# Patient Record
Sex: Male | Born: 1995 | Race: White | Hispanic: No | Marital: Married | Smoking: Former smoker
Health system: Southern US, Academic
[De-identification: ages and names within clinical notes are randomized; demographics above are authoritative.]

## PROBLEM LIST (undated history)

## (undated) DIAGNOSIS — E782 Mixed hyperlipidemia: Secondary | ICD-10-CM

## (undated) DIAGNOSIS — E785 Hyperlipidemia, unspecified: Secondary | ICD-10-CM

## (undated) HISTORY — PX: TYMPANOSTOMY TUBE PLACEMENT: SHX32

## (undated) HISTORY — DX: Hyperlipidemia, unspecified: E78.5

---
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
## 2019-04-27 ENCOUNTER — Ambulatory Visit: Payer: Self-pay | Admitting: Family Medicine

## 2019-06-02 ENCOUNTER — Ambulatory Visit: Payer: Self-pay | Admitting: Skilled Nursing Facility1

## 2019-06-08 ENCOUNTER — Telehealth: Payer: 59 | Admitting: Skilled Nursing Facility1

## 2019-07-07 ENCOUNTER — Telehealth: Payer: 59 | Admitting: Skilled Nursing Facility1

## 2019-07-15 ENCOUNTER — Ambulatory Visit: Payer: 59 | Attending: Internal Medicine

## 2019-07-15 DIAGNOSIS — Z23 Encounter for immunization: Secondary | ICD-10-CM

## 2019-07-15 NOTE — Progress Notes (Signed)
   Covid-19 Vaccination Clinic  Name:  Donald Nunez    MRN: [id number] DOB: [birth year]  07/15/2019  [name] was observed post Covid-19 immunization for 15 minutes without incident. He was provided with Vaccine Information Sheet and instruction to access the V-Safe system.   [name] was instructed to call 911 with any severe reactions post vaccine: Marland Kitchen Difficulty breathing  . Swelling of face and throat  . A fast heartbeat  . A bad rash all over body  . Dizziness and weakness   Immunizations Administered    Name Date Dose VIS Date Route   Pfizer COVID-19 Vaccine 07/15/2019  9:59 AM 0.3 mL 04/15/2019 Intramuscular   Manufacturer: [employer], [employer]   Lot: [id number]   NDC: [address]-7

## 2019-08-09 ENCOUNTER — Ambulatory Visit: Payer: 59 | Attending: Internal Medicine

## 2019-08-09 DIAGNOSIS — Z23 Encounter for immunization: Secondary | ICD-10-CM

## 2019-08-09 NOTE — Progress Notes (Signed)
   Covid-19 Vaccination Clinic  Name:  Donald Nunez    MRN: [id number] DOB: [birth year]  08/09/2019  [name] was observed post Covid-19 immunization for 15 minutes without incident. He was provided with Vaccine Information Sheet and instruction to access the V-Safe system.   [name] was instructed to call 911 with any severe reactions post vaccine: Marland Kitchen Difficulty breathing  . Swelling of face and throat  . A fast heartbeat  . A bad rash all over body  . Dizziness and weakness   Immunizations Administered    Name Date Dose VIS Date Route   Pfizer COVID-19 Vaccine 08/09/2019 10:23 AM 0.3 mL 04/15/2019 Intramuscular   Manufacturer: [employer], [employer]   Lot: [id number]   NDC: [address]-9

## 2021-08-25 ENCOUNTER — Other Ambulatory Visit: Payer: Self-pay

## 2021-08-25 ENCOUNTER — Emergency Department (HOSPITAL_COMMUNITY): Payer: Self-pay

## 2021-08-25 ENCOUNTER — Emergency Department
Admission: EM | Admit: 2021-08-25 | Discharge: 2021-08-25 | Discharge status: Against Medical Advice | Payer: Self-pay | Attending: Family | Admitting: Family

## 2021-08-25 ENCOUNTER — Encounter (HOSPITAL_COMMUNITY): Payer: Self-pay | Admitting: Family

## 2021-08-25 DIAGNOSIS — Z5329 Procedure and treatment not carried out because of patient's decision for other reasons: Secondary | ICD-10-CM | POA: Insufficient documentation

## 2021-08-25 DIAGNOSIS — E785 Hyperlipidemia, unspecified: Secondary | ICD-10-CM | POA: Insufficient documentation

## 2021-08-25 DIAGNOSIS — R079 Chest pain, unspecified: Secondary | ICD-10-CM | POA: Insufficient documentation

## 2021-08-25 HISTORY — DX: Mixed hyperlipidemia: E78.2

## 2021-08-25 LAB — CBC WITH DIFF
BASOPHIL #: 0 10*3/uL (ref 0.00–0.30)
BASOPHIL %: 0 % (ref 0–3)
EOSINOPHIL #: 0.1 10*3/uL (ref 0.00–0.80)
EOSINOPHIL %: 1 % (ref 0–7)
HCT: 47.6 % (ref 42.0–51.0)
HGB: 16.9 g/dL (ref 13.5–18.0)
LYMPHOCYTE #: 2.4 10*3/uL (ref 1.10–5.00)
LYMPHOCYTE %: 25 % (ref 25–45)
MCH: 31.8 pg (ref 27.0–32.0)
MCHC: 35.6 g/dL (ref 32.0–36.0)
MCV: 89.2 fL (ref 78.0–99.0)
MONOCYTE #: 0.7 10*3/uL (ref 0.00–1.30)
MONOCYTE %: 7 % (ref 0–12)
MPV: 9.1 fL (ref 7.4–10.4)
NEUTROPHIL #: 6.3 10*3/uL (ref 1.80–8.40)
NEUTROPHIL %: 67 % (ref 40–76)
PLATELETS: 227 10*3/uL (ref 140–440)
RBC: 5.33 10*6/uL (ref 4.20–6.00)
RDW: 13.1 % (ref 11.6–14.8)
WBC: 9.5 10*3/uL (ref 4.0–10.5)
WBCS UNCORRECTED: 9.5 10*3/uL

## 2021-08-25 LAB — COMPREHENSIVE METABOLIC PANEL, NON-FASTING
ALBUMIN/GLOBULIN RATIO: 1.9 — ABNORMAL HIGH (ref 0.8–1.4)
ALBUMIN: 5.1 g/dL (ref 3.5–5.7)
ALKALINE PHOSPHATASE: 62 U/L (ref 34–104)
ALT (SGPT): 29 U/L (ref 7–52)
ANION GAP: 15 mmol/L (ref 10–20)
AST (SGOT): 30 U/L (ref 13–39)
BILIRUBIN TOTAL: 1.2 mg/dL (ref 0.3–1.2)
BUN/CREA RATIO: 18 (ref 6–22)
BUN: 17 mg/dL (ref 7–25)
CALCIUM, CORRECTED: 9.4 mg/dL (ref 8.9–10.8)
CALCIUM: 10.5 mg/dL — ABNORMAL HIGH (ref 8.6–10.3)
CHLORIDE: 108 mmol/L — ABNORMAL HIGH (ref 98–107)
CO2 TOTAL: 18 mmol/L — ABNORMAL LOW (ref 21–31)
CREATININE: 0.93 mg/dL (ref 0.60–1.30)
ESTIMATED GFR: 117 mL/min/{1.73_m2} (ref >59)
GLOBULIN: 2.7 — ABNORMAL LOW (ref 2.9–5.4)
GLUCOSE: 109 mg/dL (ref 74–109)
OSMOLALITY, CALCULATED: 283 mOsm/kg (ref 270–290)
POTASSIUM: 3.6 mmol/L (ref 3.5–5.1)
PROTEIN TOTAL: 7.8 g/dL (ref 6.4–8.9)
SODIUM: 141 mmol/L (ref 136–145)

## 2021-08-25 LAB — TROPONIN-I
TROPONIN I: 4 ng/L (ref <20)
TROPONIN I: 4 ng/L (ref <20)
TROPONIN I: 3 ng/L (ref <20)

## 2021-08-25 LAB — LIGHT GREEN TOP TUBE

## 2021-08-25 LAB — MAGNESIUM: MAGNESIUM: 1.8 mg/dL — ABNORMAL LOW (ref 1.9–2.7)

## 2021-08-25 LAB — CREATINE KINASE (CK), TOTAL, SERUM: CREATINE KINASE: 656 U/L — ABNORMAL HIGH (ref 30–223)

## 2021-08-25 LAB — ECG 12 LEAD: QTC Calculation: 474 ms

## 2021-08-25 LAB — GOLD TOP TUBE

## 2021-08-25 LAB — LIPASE: LIPASE: 15 U/L (ref 11–82)

## 2021-08-25 LAB — LAVENDER TOP TUBE

## 2021-08-25 LAB — GRAY TOP TUBE

## 2021-08-25 LAB — BLUE TOP TUBE

## 2021-08-25 LAB — THYROID STIMULATING HORMONE (SENSITIVE TSH): TSH: 1.114 u[IU]/mL (ref 0.450–5.330)

## 2021-08-25 MED ORDER — ASPIRIN 81 MG CHEWABLE TABLET
CHEWABLE_TABLET | ORAL | Status: AC
Start: 2021-08-25 — End: 2021-08-25
  Filled 2021-08-25: qty 4

## 2021-08-25 MED ORDER — ASPIRIN 81 MG CHEWABLE TABLET
324.0000 mg | CHEWABLE_TABLET | ORAL | Status: AC
Start: 2021-08-25 — End: 2021-08-25
  Administered 2021-08-25: 324 mg via ORAL

## 2021-08-25 MED ORDER — NITROGLYCERIN 2 % TRANSDERMAL OINTMENT - PACKET
TOPICAL_OINTMENT | TRANSDERMAL | Status: AC
Start: 2021-08-25 — End: 2021-08-25
  Filled 2021-08-25: qty 1

## 2021-08-25 MED ORDER — NITROGLYCERIN 2 % TRANSDERMAL OINTMENT - PACKET
1.0000 [in_us] | TOPICAL_OINTMENT | TRANSDERMAL | Status: AC
Start: 2021-08-25 — End: 2021-08-25
  Administered 2021-08-25: 1 [in_us] via TOPICAL

## 2021-08-25 NOTE — ED Provider Notes (Signed)
[facility]  ED Primary Provider Note  History of Present Illness   Chief Complaint   Patient presents with   . Chest Pain      Arrival: The patient arrived by Car    Joel Porter is a 26 y.o. male who had concerns including Chest Pain .  Patient with past medical history of hyperlipidemia presents to the emergency room for evaluation of sudden-onset chest pain that started approximately 730-8 o'clock this a.m. while he was standing around at work.  Patient states the pain radiates to his arms and back.  He also notes associated nausea, shortness of breath, clammy skin.  He notes some tingling sensation below his elbows bilaterally.  Patient denies any new medications, vaccinations, or illness.  He states nothing seems to make the signs and symptoms better or worse.  He notes the pain to be 6/10 at present but goes as high as 10/10.  He denies any similar symptoms past.  Family history includes hyperlipidemia    Review of Systems   All other systems reviewed and are negative except as noted.    Historical Data   History Reviewed This Encounter: Medical History  Surgical History  Family History  Social History      Physical Exam   ED Triage Vitals [08/25/21 0942]   BP (Non-Invasive) (!) 145/83   Heart Rate 76   Respiratory Rate (!) 22   Temperature 36.8 C (98.2 F)   SpO2 100 %   Weight 99.8 kg (220 lb)   Height 1.829 m (6')       Constitutional:  26 y.o. male who appears in no distress. Normal color, no cyanosis.   HENT:   Head: Normocephalic and atraumatic.   Mouth/Throat: Oropharynx is clear and moist.   Eyes: EOMI, PERRL   Neck: Trachea midline. Neck supple.  Cardiovascular: RRR, S1, S2.  No murmurs, rubs or gallops. Intact distal pulses.  Midsternal chest wall tenderness noted  Pulmonary/Chest: BS clear equal bilaterally. No respiratory distress.  Respirations even and nonlabored.  No wheezes, rales or chest tenderness.   Abdominal: Bowel sounds present and normal.  Abdomen soft, no tenderness, no rebound and no guarding.  Back: No midline spinal tenderness, no paraspinal tenderness, no CVA tenderness.           Musculoskeletal: No edema, tenderness or deformity.  Skin: warm and dry. No rash, erythema, pallor or cyanosis  Psychiatric: normal mood and affect. Behavior is normal.   Neurological: Patient keenly alert and responsive, easily able to raise eyebrows, facial muscles/expressions symmetric, speaking in fluent sentences, moving all extremities equally and fully, normal gait  Patient Data     Labs Ordered/Reviewed   COMPREHENSIVE METABOLIC PANEL, NON-FASTING - Abnormal; Notable for the following components:       Result Value    CHLORIDE 108 (*)     CO2 TOTAL 18 (*)     CALCIUM 10.5 (*)     ALBUMIN/GLOBULIN RATIO 1.9 (*)     GLOBULIN 2.7 (*)     All other components within normal limits    Narrative:     Estimated Glomerular Filtration Rate (eGFR) is calculated using the CKD-EPI (2021) equation, intended for patients 61 years of age and older. If gender is not documented or "unknown", there will be no eGFR calculation.   CREATINE KINASE (CK), TOTAL, SERUM - Abnormal; Notable for the following components:    CREATINE KINASE 656 (*)     All other components within  normal limits   MAGNESIUM - Abnormal; Notable for the following components:    MAGNESIUM 1.8 (*)     All other components within normal limits   TROPONIN-I - Normal   TROPONIN-I - Normal   TROPONIN-I - Normal   LIPASE - Normal   EXTRA TUBES    Narrative:     The following orders were created for panel order EXTRA TUBES.  Procedure                               Abnormality         Status                     ---------                               -----------         ------                     BLUE TOP OPFY[[id number]]                                    Final result               GOLD TOP OTRR[[id number]]                                    Final result               LIGHT GREEN TOP BFXO[[id number]]                              Final result               LIGHT GREEN TOP AYOK[[id number]]                             Final result               LAVENDER TOP LTRV[[id number]]                                Final result               LAVENDER TOP YSHU[[id number]]                                Final result               GRAY TOP DBZM[[id number]]                                    Final result                 Please view results for these tests on the individual orders.   BLUE TOP TUBE   GOLD TOP TUBE   LIGHT GREEN TOP TUBE   LIGHT GREEN TOP TUBE   LAVENDER TOP TUBE   LAVENDER TOP TUBE   GRAY TOP  TUBE   CBC/DIFF    Narrative:     The following orders were created for panel order CBC/DIFF.  Procedure                               Abnormality         Status                     ---------                               -----------         ------                     CBC WITH QQVZ[[id number]]                                    Final result                 Please view results for these tests on the individual orders.   CBC WITH DIFF   THYROID STIMULATING HORMONE (SENSITIVE TSH)     XR AP MOBILE CHEST   Final Result by Edi, Radresults In (04/23 1116)   NEGATIVE CHEST         Radiologist location ID: [place] [facility]        Medical Decision Making  Patient was evaluated here in the emergency room, we are currently waiting for the CT to be completed.  Patient had negative troponins in the emergency room, chest x-ray was normal.  Vital signs remained stable.  Patient did improve while here in the emergency room.  RN did notify me that the patient left the facility prior to completing treatment.    Amount and/or Complexity of Data Reviewed  Labs: ordered. Decision-making details documented in ED Course.  Radiology: ordered.      Risk  OTC drugs.  Prescription drug management.        ED Course as of 08/25/21 1520   Sun Aug 25, 2021   1214 Pending labs for disposition   1236 Called lab regarding CMP, Lipas, TSH, MG CK levels still  pending. Katharine Look states it looks like it is done. Spoke to Jeffers who states she just placed it on the analyzer.   1414 Pending labs so that the CT can be completed.   [place] for dispo. Discussed current labs with patient and that we were waiting on his CT scan. Pt verbalized understanding.    [place] in [place] who states he is aware of patient needing Ct and will get to it when he can.    [place] Notified by nursing that pt eloped.          Medications Administered in the ED   aspirin chewable tablet 324 mg (324 mg Oral Given 08/25/21 1058)   nitroGLYCERIN (NITRO-BID) 2 % topical ointment (1 Inch Apply Topically Given 08/25/21 1058)     Clinical Impression   Chest pain, unspecified type (Primary)       Disposition: AMA

## 2021-08-25 NOTE — ED Nurses Note (Signed)
Patient eloped from department. Nurse seen patient walking down hallway and removed IV before patient left department. Nurse notified me of patients elopement.

## 2021-08-25 NOTE — ED Triage Notes (Signed)
Midsternal chest pain that started at approx 0830 while at work today; radiates to both arms; no relieving/exacerbating factors; no cardiac hx     H/o High cholesterol

## 2021-08-25 NOTE — ED Nurses Note (Signed)
Patient to EDM 23 via ambulation with complaints of mid sternal chest pain, SOB, and numbness in bilateral arms that started at 0830 today while at work. Patient states current pain is 7/10. Patient placed on bedside monitoring. Assessment as documented. Call bell in reach.

## 2021-08-26 LAB — ECG 12 LEAD
Atrial Rate: 92 {beats}/min
Calculated P Axis: 37 degrees
Calculated R Axis: 63 degrees
Calculated T Axis: 7 degrees
PR Interval: 158 ms
QRS Duration: 86 ms
QT Interval: 384 ms
Ventricular rate: 92 {beats}/min

## 2021-09-18 ENCOUNTER — Encounter: Payer: Self-pay | Admitting: Nurse Practitioner

## 2021-09-18 ENCOUNTER — Ambulatory Visit (INDEPENDENT_AMBULATORY_CARE_PROVIDER_SITE_OTHER): Payer: Self-pay | Admitting: Nurse Practitioner

## 2021-09-18 VITALS — BP 149/91 | HR 75 | Temp 98.5°F | Ht 72.0 in | Wt 218.2 lb

## 2021-09-18 DIAGNOSIS — E781 Pure hyperglyceridemia: Secondary | ICD-10-CM

## 2021-09-18 DIAGNOSIS — F419 Anxiety disorder, unspecified: Secondary | ICD-10-CM

## 2021-09-18 DIAGNOSIS — F32A Depression, unspecified: Secondary | ICD-10-CM

## 2021-09-18 DIAGNOSIS — Z0000 Encounter for general adult medical examination without abnormal findings: Secondary | ICD-10-CM

## 2021-09-18 LAB — POCT URINALYSIS DIP (CLINITEK)
Bilirubin, UA: NEGATIVE
Glucose, UA: NEGATIVE mg/dL
Ketones, POC UA: NEGATIVE mg/dL
Leukocytes, UA: NEGATIVE
Nitrite, UA: NEGATIVE
POC PROTEIN,UA: NEGATIVE
Spec Grav, UA: 1.02 (ref 1.010–1.025)
Urobilinogen, UA: 0.2 E.U./dL
pH, UA: 6.5 (ref 5.0–8.0)

## 2021-09-18 MED ORDER — BUSPIRONE HCL 10 MG PO TABS: 10.0000 mg | ORAL_TABLET | Freq: Three times a day (TID) | ORAL | 0 refills | Status: AC

## 2021-09-18 MED ORDER — CITALOPRAM HYDROBROMIDE 40 MG PO TABS
40.0000 mg | ORAL_TABLET | Freq: Every day | ORAL | 3 refills | Status: DC
Start: 2021-09-18 — End: 2022-04-25

## 2021-09-18 MED ORDER — GEMFIBROZIL 600 MG PO TABS: 600.0000 mg | ORAL_TABLET | Freq: Two times a day (BID) | ORAL | 2 refills | Status: DC

## 2021-09-18 NOTE — Progress Notes (Signed)
@Patient  ID: , male    DOB: 10-25-1995, 26 y.o.   MRN: [id number] ? ?Chief Complaint  ?Patient presents with  ? Establish Care  ?  Pt is here to establish care. Pt stated he is here because he need his anti depression and  triglycerides medications. Pt stated he has trouble sleeping every night  ? ? ?Referring provider: ?[id number], MD ? ? ?HPI ? ?26 year old male with history of anxiety and depression and elevated triglyceride levels. ? ? ?Patient presents today to establish care.  He was previously seen by a PCP over a year ago.  He had lost his insurance and had to quit going to the doctor.  He has been out of his medications since January.  He was previously on citalopram, BuSpar, gemfibrozil.  Patient takes melatonin OTC at night for sleep.  Overall he states that he has been doing well he would just like to get back on his anxiety depression medicine as well as the gemfibrozil.  Patient does need blood work today.  He is fasting.  We discussed that his blood pressure was borderline today.  He will need to follow a low-salt diet and keep a check on this.  We will have him return in 1 month to see how his blood pressure is doing. Denies f/c/s, n/v/d, hemoptysis, chest pain or edema. ? ? ? ? ? ?No Known Allergies ? ?Immunization History  ?Administered Date(s) Administered  ? PFIZER Comirnaty(Gray Top)Covid-19 Tri-Sucrose Vaccine 07/15/2019, 08/09/2019  ? PFIZER(Purple Top)SARS-COV-2 Vaccination 07/15/2019, 08/09/2019  ? ? ?Past Medical History:  ?Diagnosis Date  ? Hyperlipidemia   ? ? ?Tobacco History: ?Social History  ? ?Tobacco Use  ?Smoking Status Every Day  ? Packs/day: 0.50  ? Types: Cigarettes  ? Start date: 03/14/2012  ?Smokeless Tobacco Never  ? ?Ready to quit: Not Answered ?Counseling given: Not Answered ? ? ?Outpatient Encounter Medications as of 09/18/2021  ?Medication Sig  ? busPIRone (BUSPAR) 10 MG tablet Take 1 tablet (10 mg total) by mouth 3 (three) times daily.  ? citalopram  (CELEXA) 40 MG tablet Take 1 tablet (40 mg total) by mouth daily.  ? gemfibrozil (LOPID) 600 MG tablet Take 1 tablet (600 mg total) by mouth 2 (two) times daily before a meal.  ? [DISCONTINUED] amoxicillin-clavulanate (AUGMENTIN) 875-125 MG tablet Take 1 tablet by mouth 2 (two) times daily. (Patient not taking: Reported on 09/18/2021)  ? ?No facility-administered encounter medications on file as of 09/18/2021.  ? ? ? ?Review of Systems ? ?Review of Systems  ? ? ? ?Physical Exam ? ?BP (!) 149/91 (BP Location: Left Arm, Patient Position: Sitting, Cuff Size: Normal)   Pulse 75   Temp 98.5 ?F (36.9 ?C)   Ht 6' (1.829 m)   Wt 218 lb 4 oz (99 kg)   SpO2 100%   BMI 29.60 kg/m?  ? ?Wt Readings from Last 5 Encounters:  ?09/18/21 218 lb 4 oz (99 kg)  ?04/20/15 224 lb (101.6 kg) (98 %, Z= 2.00)*  ?03/14/14 229 lb 12.8 oz (104.2 kg) (99 %, Z= 2.20)*  ?11/30/12 214 lb (97.1 kg) (98 %, Z= 2.15)*  ?11/11/12 219 lb (99.3 kg) (99 %, Z= 2.26)*  ? ?* Growth percentiles are based on CDC (Boys, 2-20 Years) data.  ? ? ? ?Physical Exam ? ? ?Lab Results: ? ?CBC ?   ?Component Value Date/Time  ? WBC 9.7 11/30/2012 0950  ? RBC 5.44 11/30/2012 0950  ? HGB 16.5 (H) 11/30/2012 0950  ?  HCT 47.0 11/30/2012 0950  ? PLT 197 11/30/2012 0950  ? MCV 86.4 11/30/2012 0950  ? MCH 30.3 11/30/2012 0950  ? MCHC 35.1 11/30/2012 0950  ? RDW 13.1 11/30/2012 0950  ? LYMPHSABS 1.6 11/30/2012 0950  ? MONOABS 0.6 11/30/2012 0950  ? EOSABS 0.1 11/30/2012 0950  ? BASOSABS 0.0 11/30/2012 0950  ? ? ?BMET ?   ?Component Value Date/Time  ? NA 141 11/30/2012 0950  ? K 4.1 11/30/2012 0950  ? CL 102 11/30/2012 0950  ? CO2 27 11/30/2012 0950  ? GLUCOSE 101 (H) 11/30/2012 0950  ? BUN 11 11/30/2012 0950  ? CREATININE 0.70 11/30/2012 0950  ? CALCIUM 10.6 (H) 11/30/2012 0950  ? GFRNONAA NOT CALCULATED 11/30/2012 0950  ? GFRAA NOT CALCULATED 11/30/2012 0950  ? ? ?BNP ?No results found for: BNP ? ?ProBNP ?No results found for: PROBNP ? ?Imaging: ?No results  found. ? ? ?Assessment & Plan:  ? ?No problem-specific Assessment & Plan notes found for this encounter. ? ? ? ? ?Ivonne Andrew, NP ?09/18/2021 ? ?

## 2021-09-18 NOTE — Assessment & Plan Note (Signed)
-   CBC ?- Comprehensive metabolic panel ?- Lipid Panel ?- POCT URINALYSIS DIP (CLINITEK) ? ?2. Anxiety and depression ? ?- citalopram (CELEXA) 40 MG tablet; Take 1 tablet (40 mg total) by mouth daily.  Dispense: 30 tablet; Refill: 3 ?- busPIRone (BUSPAR) 10 MG tablet; Take 1 tablet (10 mg total) by mouth 3 (three) times daily.  Dispense: 90 tablet; Refill: 0 ? ?3. High triglycerides ? ?- gemfibrozil (LOPID) 600 MG tablet; Take 1 tablet (600 mg total) by mouth 2 (two) times daily before a meal.  Dispense: 60 tablet; Refill: 2 ? ?4. Elevated BP  ? ?Low salt diet ? ?Will recheck in 1 month ? ? ?Follow up: ? ?

## 2021-09-18 NOTE — Patient Instructions (Addendum)
1. Routine health maintenance ? ?- CBC ?- Comprehensive metabolic panel ?- Lipid Panel ?- POCT URINALYSIS DIP (CLINITEK) ? ?2. Anxiety and depression ? ?- citalopram (CELEXA) 40 MG tablet; Take 1 tablet (40 mg total) by mouth daily.  Dispense: 30 tablet; Refill: 3 ?- busPIRone (BUSPAR) 10 MG tablet; Take 1 tablet (10 mg total) by mouth 3 (three) times daily.  Dispense: 90 tablet; Refill: 0 ? ?3. High triglycerides ? ?- gemfibrozil (LOPID) 600 MG tablet; Take 1 tablet (600 mg total) by mouth 2 (two) times daily before a meal.  Dispense: 60 tablet; Refill: 2 ? ?4. Elevated BP  ? ?Low salt diet ? ?Will recheck in 1 month ? ? ?Follow up: ? ?Follow up in 6 months or sooner if needed ? ?

## 2021-09-19 LAB — COMPREHENSIVE METABOLIC PANEL
ALT: 28 IU/L (ref 0–44)
AST: 22 IU/L (ref 0–40)
Albumin/Globulin Ratio: 2 (ref 1.2–2.2)
Albumin: 5.2 g/dL (ref 4.1–5.2)
Alkaline Phosphatase: 76 IU/L (ref 44–121)
BUN/Creatinine Ratio: 13 (ref 9–20)
BUN: 11 mg/dL (ref 6–20)
Bilirubin Total: 0.6 mg/dL (ref 0.0–1.2)
CO2: 22 mmol/L (ref 20–29)
Calcium: 10 mg/dL (ref 8.7–10.2)
Chloride: 104 mmol/L (ref 96–106)
Creatinine, Ser: 0.83 mg/dL (ref 0.76–1.27)
Globulin, Total: 2.6 g/dL (ref 1.5–4.5)
Glucose: 94 mg/dL (ref 70–99)
Potassium: 4.3 mmol/L (ref 3.5–5.2)
Sodium: 141 mmol/L (ref 134–144)
Total Protein: 7.8 g/dL (ref 6.0–8.5)
eGFR: 125 mL/min/{1.73_m2} (ref >59)

## 2021-09-19 LAB — CBC
Hematocrit: 48.3 % (ref 37.5–51.0)
Hemoglobin: 17.4 g/dL (ref 13.0–17.7)
MCH: 31.9 pg (ref 26.6–33.0)
MCHC: 36 g/dL — ABNORMAL HIGH (ref 31.5–35.7)
MCV: 89 fL (ref 79–97)
Platelets: 233 10*3/uL (ref 150–450)
RBC: 5.45 x10E6/uL (ref 4.14–5.80)
RDW: 12.7 % (ref 11.6–15.4)
WBC: 11.2 10*3/uL — ABNORMAL HIGH (ref 3.4–10.8)

## 2021-09-19 LAB — LIPID PANEL
Chol/HDL Ratio: 6.6 ratio — ABNORMAL HIGH (ref 0.0–5.0)
Cholesterol, Total: 230 mg/dL — ABNORMAL HIGH (ref 100–199)
HDL: 35 mg/dL — ABNORMAL LOW (ref >39)
LDL Chol Calc (NIH): 83 mg/dL (ref 0–99)
Triglycerides: 691 mg/dL (ref 0–149)
VLDL Cholesterol Cal: 112 mg/dL — ABNORMAL HIGH (ref 5–40)

## 2021-10-21 ENCOUNTER — Ambulatory Visit: Payer: Self-pay | Admitting: Nurse Practitioner

## 2021-10-24 ENCOUNTER — Ambulatory Visit (INDEPENDENT_AMBULATORY_CARE_PROVIDER_SITE_OTHER): Payer: Self-pay | Admitting: Nurse Practitioner

## 2021-10-24 ENCOUNTER — Encounter: Payer: Self-pay | Admitting: Nurse Practitioner

## 2021-10-24 VITALS — BP 125/67 | HR 62 | Temp 97.8°F | Ht 72.0 in | Wt 223.5 lb

## 2021-10-24 DIAGNOSIS — F419 Anxiety disorder, unspecified: Secondary | ICD-10-CM

## 2021-10-24 DIAGNOSIS — F32A Depression, unspecified: Secondary | ICD-10-CM

## 2021-10-24 MED ORDER — BUSPIRONE HCL 10 MG PO TABS: 10.0000 mg | ORAL_TABLET | Freq: Three times a day (TID) | ORAL | 2 refills | Status: AC

## 2021-10-24 NOTE — Progress Notes (Signed)
@Patient  ID: , male    DOB: [birth year], 26 y.o.   MRN: [id number]  Chief Complaint  Patient presents with   Follow-up    Pt is here for 1 month follow up visit. Pt is requesting refill on BUSPAR    Referring provider: [id number], MD   HPI  Patient presents for a follow up visit. He states that he has been doing well. Blood pressure is well controlled today. Patient is tolerating medications well. He does need a refill on Buspar. Denies f/c/s, n/v/d, hemoptysis, PND, chest pain or edema.      No Known Allergies  Immunization History  Administered Date(s) Administered   PFIZER Comirnaty(Gray Top)Covid-19 Tri-Sucrose Vaccine 07/15/2019, 08/09/2019   PFIZER(Purple Top)SARS-COV-2 Vaccination 07/15/2019, 08/09/2019    Past Medical History:  Diagnosis Date   Hyperlipidemia     Tobacco History: Social History   Tobacco Use  Smoking Status Every Day   Packs/day: 0.50   Types: Cigarettes   Start date: 03/14/2012  Smokeless Tobacco Never   Ready to quit: Not Answered Counseling given: Not Answered   Outpatient Encounter Medications as of 10/24/2021  Medication Sig   citalopram (CELEXA) 40 MG tablet Take 1 tablet (40 mg total) by mouth daily.   gemfibrozil (LOPID) 600 MG tablet Take 1 tablet (600 mg total) by mouth 2 (two) times daily before a meal.   [DISCONTINUED] busPIRone (BUSPAR) 10 MG tablet Take 10 mg by mouth 3 (three) times daily.   busPIRone (BUSPAR) 10 MG tablet Take 1 tablet (10 mg total) by mouth 3 (three) times daily.   No facility-administered encounter medications on file as of 10/24/2021.     Review of Systems  Review of Systems  Constitutional: Negative.   HENT: Negative.    Cardiovascular: Negative.   Gastrointestinal: Negative.   Allergic/Immunologic: Negative.   Neurological: Negative.   Psychiatric/Behavioral: Negative.         Physical Exam  BP 125/67 (BP Location: Right Arm, Patient Position: Sitting, Cuff  Size: Large)   Pulse 62   Temp 97.8 F (36.6 C)   Ht 6' (1.829 m)   Wt 223 lb 8 oz (101.4 kg)   SpO2 100%   BMI 30.31 kg/m   Wt Readings from Last 5 Encounters:  10/24/21 223 lb 8 oz (101.4 kg)  09/18/21 218 lb 4 oz (99 kg)  04/20/15 224 lb (101.6 kg) (98 %, Z= 2.00)*  03/14/14 229 lb 12.8 oz (104.2 kg) (99 %, Z= 2.20)*  11/30/12 214 lb (97.1 kg) (98 %, Z= 2.15)*   * Growth percentiles are based on CDC (Boys, 2-20 Years) data.     Physical Exam Vitals and nursing note reviewed.  Constitutional:      General: He is not in acute distress.    Appearance: He is well-developed.  Cardiovascular:     Rate and Rhythm: Normal rate and regular rhythm.  Pulmonary:     Effort: Pulmonary effort is normal.     Breath sounds: Normal breath sounds.  Skin:    General: Skin is warm and dry.  Neurological:     Mental Status: He is alert and oriented to person, place, and time.       Assessment & Plan:   Anxiety and depression - busPIRone (BUSPAR) 10 MG tablet; Take 1 tablet (10 mg total) by mouth 3 (three) times daily.  Dispense: 90 tablet; Refill: 2   Follow up:  Follow up in 5 months or sooner if needed - for cholesterol  recheck     Ivonne Andrew, NP 10/24/2021

## 2021-10-24 NOTE — Patient Instructions (Signed)
1. Anxiety and depression  - busPIRone (BUSPAR) 10 MG tablet; Take 1 tablet (10 mg total) by mouth 3 (three) times daily.  Dispense: 90 tablet; Refill: 2   Follow up:  Follow up in 5 months or sooner if needed - for cholesterol recheck

## 2021-10-24 NOTE — Assessment & Plan Note (Signed)
-   busPIRone (BUSPAR) 10 MG tablet; Take 1 tablet (10 mg total) by mouth 3 (three) times daily.  Dispense: 90 tablet; Refill: 2   Follow up:  Follow up in 5 months or sooner if needed - for cholesterol recheck

## 2022-02-07 ENCOUNTER — Other Ambulatory Visit: Payer: Self-pay | Admitting: Nurse Practitioner

## 2022-02-07 DIAGNOSIS — E781 Pure hyperglyceridemia: Secondary | ICD-10-CM

## 2022-02-25 ENCOUNTER — Telehealth: Payer: Self-pay

## 2022-02-25 NOTE — Telephone Encounter (Signed)
Cholesterol med to be refill

## 2022-02-26 ENCOUNTER — Other Ambulatory Visit: Payer: Self-pay | Admitting: Nurse Practitioner

## 2022-02-26 DIAGNOSIS — E781 Pure hyperglyceridemia: Secondary | ICD-10-CM

## 2022-02-26 MED ORDER — GEMFIBROZIL 600 MG PO TABS: 600.0000 mg | ORAL_TABLET | Freq: Two times a day (BID) | ORAL | 2 refills | Status: DC

## 2022-02-26 NOTE — Telephone Encounter (Signed)
Pt is needing refill no recent labs on file.

## 2022-03-26 ENCOUNTER — Ambulatory Visit: Payer: Self-pay | Admitting: Nurse Practitioner

## 2022-04-07 ENCOUNTER — Ambulatory Visit: Payer: Self-pay | Admitting: Nurse Practitioner

## 2022-04-17 ENCOUNTER — Other Ambulatory Visit: Payer: Self-pay | Admitting: Nurse Practitioner

## 2022-04-17 DIAGNOSIS — F419 Anxiety disorder, unspecified: Secondary | ICD-10-CM

## 2022-04-25 ENCOUNTER — Telehealth (INDEPENDENT_AMBULATORY_CARE_PROVIDER_SITE_OTHER): Payer: Self-pay | Admitting: Nurse Practitioner

## 2022-04-25 ENCOUNTER — Encounter: Payer: Self-pay | Admitting: Nurse Practitioner

## 2022-04-25 VITALS — Ht 72.0 in | Wt 223.0 lb

## 2022-04-25 DIAGNOSIS — E781 Pure hyperglyceridemia: Secondary | ICD-10-CM

## 2022-04-25 DIAGNOSIS — F419 Anxiety disorder, unspecified: Secondary | ICD-10-CM

## 2022-04-25 DIAGNOSIS — F32A Depression, unspecified: Secondary | ICD-10-CM

## 2022-04-25 MED ORDER — GEMFIBROZIL 600 MG PO TABS: 600.0000 mg | ORAL_TABLET | Freq: Two times a day (BID) | ORAL | 2 refills | Status: DC

## 2022-04-25 MED ORDER — CITALOPRAM HYDROBROMIDE 40 MG PO TABS: 40.0000 mg | ORAL_TABLET | Freq: Every day | ORAL | 3 refills | Status: DC

## 2022-04-25 NOTE — Patient Instructions (Addendum)
1. Anxiety and depression  - citalopram (CELEXA) 40 MG tablet; Take 1 tablet (40 mg total) by mouth daily.  Dispense: 30 tablet; Refill: 3  2. High triglycerides  - gemfibrozil (LOPID) 600 MG tablet; Take 1 tablet (600 mg total) by mouth 2 (two) times daily before a meal.  Dispense: 60 tablet; Refill: 2   Follow up:  Follow up in 6 months

## 2022-04-25 NOTE — Progress Notes (Signed)
Virtual Visit via Telephone Note  I connected with Brynda Peon on 04/25/22 at  9:00 AM EST by telephone and verified that I am speaking with the correct person using two identifiers.  Location: Patient: home Provider: office   I discussed the limitations, risks, security and privacy concerns of performing an evaluation and management service by telephone and the availability of in person appointments. I also discussed with the patient that there may be a patient responsible charge related to this service. The patient expressed understanding and agreed to proceed.   History of Present Illness:  Patient presents through virtual visit for follow-up visit.  He does need medications for chronic conditions refilled today.  He is currently in [place].  Patient does need cholesterol rechecked.  We will order future labs for him. Denies f/c/s, n/v/d, hemoptysis, PND, leg swelling Denies chest pain or edema       Observations/Objective:      04/25/2022    8:45 AM 10/24/2021    8:31 AM 09/18/2021    2:21 PM  Vitals with BMI  Height 6\' 0"  6\' 0"  6\' 0"   Weight 223 lbs 223 lbs 8 oz 218 lbs 4 oz  BMI 30.24 [id number]  Systolic  0000000 [id number]  Diastolic  67 91  Pulse  62 75      Assessment and Plan:  1. Anxiety and depression  - citalopram (CELEXA) 40 MG tablet; Take 1 tablet (40 mg total) by mouth daily.  Dispense: 30 tablet; Refill: 3  2. High triglycerides  - gemfibrozil (LOPID) 600 MG tablet; Take 1 tablet (600 mg total) by mouth 2 (two) times daily before a meal.  Dispense: 60 tablet; Refill: 2   Follow up:  Follow up in 6 months    I discussed the assessment and treatment plan with the patient. The patient was provided an opportunity to ask questions and all were answered. The patient agreed with the plan and demonstrated an understanding of the instructions.   The patient was advised to call back or seek an in-person evaluation if the symptoms worsen or  if the condition fails to improve as anticipated.  I provided 23 minutes of non-face-to-face time during this encounter.   Fenton Foy, NP

## 2022-05-06 ENCOUNTER — Other Ambulatory Visit: Payer: Self-pay

## 2022-05-06 DIAGNOSIS — E781 Pure hyperglyceridemia: Secondary | ICD-10-CM

## 2022-05-06 NOTE — Telephone Encounter (Signed)
Caller & Relationship to patient:  MRN #  [id number]   Call Back Number:   Date of Last Office Visit: 04/07/2022     Date of Next Office Visit: 05/06/2022    Medication(s) to be Refilled: citalopram gemfibrozil  WITH only two refills and needs more since he come back in [place]  Preferred Pharmacy:   ** Please notify patient to allow 48-72 hours to process** **Let patient know to contact pharmacy at the end of the day to make sure medication is ready. ** **If patient has not been seen in a year or longer, book an appointment **Advise to use MyChart for refill requests OR to contact their pharmacy

## 2022-05-07 ENCOUNTER — Other Ambulatory Visit: Payer: Self-pay | Admitting: Nurse Practitioner

## 2022-05-07 DIAGNOSIS — F32A Depression, unspecified: Secondary | ICD-10-CM

## 2022-05-07 DIAGNOSIS — E781 Pure hyperglyceridemia: Secondary | ICD-10-CM

## 2022-05-07 LAB — LIPID PANEL
Chol/HDL Ratio: 16.2 ratio — ABNORMAL HIGH (ref 0.0–5.0)
Cholesterol, Total: 356 mg/dL — ABNORMAL HIGH (ref 100–199)
HDL: 22 mg/dL — ABNORMAL LOW (ref >39)
Triglycerides: 1857 mg/dL (ref 0–149)

## 2022-05-07 LAB — COMPREHENSIVE METABOLIC PANEL
ALT: 65 IU/L — ABNORMAL HIGH (ref 0–44)
AST: 39 IU/L (ref 0–40)
Albumin/Globulin Ratio: 2.6 — ABNORMAL HIGH (ref 1.2–2.2)
Albumin: 5.1 g/dL (ref 4.3–5.2)
Alkaline Phosphatase: 72 IU/L (ref 44–121)
BUN/Creatinine Ratio: 16 (ref 9–20)
BUN: 13 mg/dL (ref 6–20)
Bilirubin Total: 0.4 mg/dL (ref 0.0–1.2)
CO2: 18 mmol/L — ABNORMAL LOW (ref 20–29)
Calcium: 9.8 mg/dL (ref 8.7–10.2)
Chloride: 100 mmol/L (ref 96–106)
Creatinine, Ser: 0.83 mg/dL (ref 0.76–1.27)
Globulin, Total: 2 g/dL (ref 1.5–4.5)
Glucose: 81 mg/dL (ref 70–99)
Potassium: 3.8 mmol/L (ref 3.5–5.2)
Sodium: 137 mmol/L (ref 134–144)
Total Protein: 7.1 g/dL (ref 6.0–8.5)
eGFR: 125 mL/min/{1.73_m2} (ref >59)

## 2022-05-07 LAB — CBC
Hematocrit: 46.8 % (ref 37.5–51.0)
Hemoglobin: 16.8 g/dL (ref 13.0–17.7)
MCH: 32.4 pg (ref 26.6–33.0)
MCHC: 35.9 g/dL — ABNORMAL HIGH (ref 31.5–35.7)
MCV: 90 fL (ref 79–97)
Platelets: 202 10*3/uL (ref 150–450)
RBC: 5.19 x10E6/uL (ref 4.14–5.80)
RDW: 13.1 % (ref 11.6–15.4)
WBC: 10.6 10*3/uL (ref 3.4–10.8)

## 2022-05-07 MED ORDER — GEMFIBROZIL 600 MG PO TABS: 600.0000 mg | ORAL_TABLET | Freq: Two times a day (BID) | ORAL | 2 refills | Status: DC

## 2022-05-07 MED ORDER — CITALOPRAM HYDROBROMIDE 40 MG PO TABS: 40.0000 mg | ORAL_TABLET | Freq: Every day | ORAL | 3 refills | Status: DC

## 2022-05-09 ENCOUNTER — Encounter: Payer: Self-pay | Admitting: *Deleted

## 2022-05-12 ENCOUNTER — Telehealth: Payer: Self-pay

## 2022-05-12 NOTE — Progress Notes (Signed)
   Care Guide Note  05/12/2022 Name: Donald Nunez MRN: [id number] DOB: [birth year]  Referred by: Leonel Ramsay, MD Reason for referral : Care Coordination (Outreach to schedule referral with Pharm d )   Donald Nunez is a 27 y.o. year old male who is a primary care patient of Leonel Ramsay, MD. Donald Nunez was referred to the pharmacist for assistance related to DM.    Successful contact was made with the patient to discuss pharmacy services including being ready for the pharmacist to call at least 5 minutes before the scheduled appointment time, to have medication bottles and any blood sugar or blood pressure readings ready for review. The patient agreed to meet with the pharmacist via with the pharmacist via telephone visit on (date/time).  05/22/2022  Noreene Larsson, [place], [place] [address] Direct Dial: [phone] [email or web address]

## 2022-05-22 ENCOUNTER — Other Ambulatory Visit: Payer: Self-pay | Admitting: Pharmacist

## 2022-05-22 NOTE — Patient Instructions (Addendum)
Hi Donald Nunez,   It was great talking to you today!  I'm going to have our Education officer, museum, Manuela Schwartz, reach out to you about the financial aid application and then we can refer you to Cardiology.   Here is what we discussed from a nutritional and lifestyle perspective:  - Reduce alcohol; limit to no more than 1-2 servings per day, but less is better when it comes to your triglycerides and weight loss - Reduce fatty foods like red meats; focus mostly on lean proteins (chicken, fish); vegetables, and whole grains.  - Avoid extra sugars in drinks or sweets (you are doing a great job with this!) - Increase your physical activity  - Weight loss of ~5-10% from baseline (so 10-20 lbs for you) can make a big difference in lowering triglycerides   Here are a couple of websites with more information: HugeFiesta.com.pt.pdf PharmacyFile.com.br.pdf  Check out the Quit Line for information/support on quitting smoking.   CDApps.pl or call 1-800-QUIT-NOW. You can also text "Ready" to 34191. You could be eligible for free nicotine patches + gum to help you quit smoking. If not, you can purchase nicotine patches and gum over the counter.     Please call me with any questions or concerns.   Thanks!  [place], PharmD [phone]

## 2022-05-22 NOTE — Progress Notes (Signed)
05/22/2022 Name: Donald Nunez MRN: [id number] DOB: 1995-05-29  Chief Complaint  Patient presents with   Medication Management   Hyperlipidemia    Donald Nunez is a 27 y.o. year old male who presented for a telephone visit.   They were referred to the pharmacist by their PCP for assistance in managing hyperlipidemia.   Subjective:  Care Team: Primary Care Provider: Lazaro Nunez ; Next Scheduled Visit: 10/27/22  Medication Access/Adherence  Current Pharmacy:  [facility] [address] [place] [address] Phone: [phone] Fax: [phone]  [place], [place] [address] Phone: [phone] Fax: [phone]   Patient reports affordability concerns with their medications: No  Patient reports access/transportation concerns to their pharmacy: No  Patient reports adherence concerns with their medications:  No    Notes his citalopram and gemfibrozil are affordable at this time.    Hyperlipidemia/ASCVD Risk Reduction  History of hypertriglyceridemia, first diagnosed at age ~46; previously evaluated by pediatric cardiology at [facility] in 2014; at that time, treated with fenofibrate. Noted CK elevation at that time, evaluated by pediatric neurology. EMG normal. Last seen by pediatric cardiology 12/2013; most recent CK was 876 at that time, TG was 448. Appears follow up was transitioned to PCP around that time; patient re-established with [facility] PCP in 05/2019. TG were managed through diet/exercise at that time, but lost to follow up in 2022 due to loss of insurance.   Patient re-established with [name] in 09/2021.   ED visit at outside hospital in 4/23 with chest pain; troponin normal but CK elevated at 656. Appears patient eloped before abdominal CT.   Current lipid lowering medications: gemfibrozil 600 mg twice daily (he had been  out of therapy for ~ 2 weeks prior to labs were drawn) Medications tried in the past: fenofibrate  Does report alcohol: 2-3 beers/night.   Antiplatelet regimen:   ASCVD History: none Family History: per chart review, mother with hyperlipidemia; maternal grandparents with heart disease  Current Meal Patterns: - Breakfast: biscuit, sausage, gravy, egg, bacon; water  - Lunch: Lo mein noodle w/ general tso's chicken; works at [employer] and eats in [employer] court most days - Supper: grilled means, green vegetables; generally has a carb on the side as well  Current physical activity: walks 5-8 miles at work and active outside when weather is good  Tobacco Abuse:  Tobacco Use History: Age when started using tobacco on a daily basis: 13 Number of cigarettes per day 1/2 ppd  Quit Attempt History: Most recent quit attempt a few years ago; quit ~ 3 months with nicotine gum. Started smoking again due to personal stressors.  Longest time ever been tobacco free 3 Reports his parents both tried Chantix and "got angry".   Notes he knows he needs to quit smoking, but finds it very difficult.   Objective:   Lab Results  Component Value Date   CREATININE 0.83 05/06/2022   BUN 13 05/06/2022   NA 137 05/06/2022   K 3.8 05/06/2022   CL 100 05/06/2022   CO2 18 (L) 05/06/2022    Lab Results  Component Value Date   CHOL 356 (H) 05/06/2022   HDL 22 (L) 05/06/2022   LDLCALC Comment (A) 05/06/2022   TRIG 1,857 (HH) 05/06/2022   CHOLHDL 16.2 (H) 05/06/2022    Medications Reviewed Today     Reviewed by  Donald Nunez, RPH-CPP (Pharmacist) on 05/22/22 at 1456  Med List Status: <None>   Medication Order Taking? Sig Documenting Provider Last Dose Status Informant  citalopram (CELEXA) 40 MG tablet [id number] Yes Take 1 tablet (40 mg total) by mouth daily. Donald Foy, NP Taking Active   gemfibrozil (LOPID) 600 MG tablet [id number] Yes Take 1 tablet (600 mg total) by mouth 2  (two) times daily before a meal. Donald Foy, NP Taking Active               Assessment/Plan:   Hyperlipidemia/ASCVD Risk Reduction: - Currently uncontrolled.  - Reviewed long term complications of uncontrolled cholesterol, including elevated risk of pancreatitis with TG - Reviewed dietary recommendations including: reducing alcohol to no more than 2 drinks per day, preferably less; limiting starches; limit high fat foods; focus on lean proteins, vegetables, whole grains.  - Reviewed lifestyle recommendations including increasing physical activity; goal 5-10% weight loss - Recommend to quit smoking. Discussed Chantix vs dual nicotine replacement therapy; patient elects to trial nicotine patches + gum. Counseled on use of patches, gum.  - Recommend to continue gemfibrozil at this time; recommend referral to cardiology/lipid clinic for advanced management and CV risk reduction. Will refer to SW for financial support counseling first.    Follow Up Plan: follow up in 4 weeks  Donald Nunez, PharmD, [place], [facility] [phone]

## 2022-05-29 ENCOUNTER — Telehealth: Payer: Self-pay | Admitting: Clinical

## 2022-05-29 NOTE — Telephone Encounter (Signed)
Integrated Behavioral Health Referral Note  05/29/2022 Name: Donald Nunez MRN: [id number] DOB: 05-31-1995 Donald Nunez is a 27 y.o. year old male who sees Fenton Foy, NP for primary care. LCSW was consulted to assess patient's needs and assist the patient with Financial Difficulties related to lack of health coverage.   Interpreter: No.   Interpreter Name & Language: none  Assessment: Patient experiencing financial difficulties related to lack of health coverage.   Intervention: CSW called patient to follow up on referral from pharmacist due to patient being uninsured. Patient missed the deadline for coverage with his employer. Based on income, he is likely eligible for Medicaid. Advised patient on how to apply for Medicaid. CSW available from clinic for assistance as patient goes through application process.  Review of patient status, including review of consultants reports, relevant laboratory and other test results, and collaboration with appropriate care team members and the patient's provider was performed as part of comprehensive patient evaluation and provision of services.    Estanislado Emms, [facility] [phone]

## 2022-06-24 ENCOUNTER — Telehealth: Payer: Self-pay | Admitting: Pharmacist

## 2022-06-24 ENCOUNTER — Other Ambulatory Visit: Payer: Self-pay | Admitting: Pharmacist

## 2022-06-24 DIAGNOSIS — E781 Pure hyperglyceridemia: Secondary | ICD-10-CM

## 2022-06-24 NOTE — Progress Notes (Unsigned)
Attempted to contact patient for scheduled appointment for medication management. Left HIPAA compliant message for patient to return my call at their convenience.   Catie Hedwig Morton, PharmD, [place], [facility] [phone]

## 2022-06-24 NOTE — Progress Notes (Unsigned)
06/24/2022 Name: Taelyn Aman MRN: [id number] DOB: [birth year]  Chief Complaint  Patient presents with   Medication Management   Hyperlipidemia    Donold Sheffler is a 27 y.o. year old male who presented for a telephone visit.   They were referred to the pharmacist by their PCP for assistance in managing hyperlipidemia.    Subjective:  Care Team: Primary Care Provider: Fenton Foy, NP ; Next Scheduled Visit: not scheduled  Medication Access/Adherence  Current Pharmacy:  [facility] [address] [place] [address] Phone: [phone] Fax: [phone]  [facility] [address] [place] [address] Phone: [phone] Fax: [phone]   Patient reports affordability concerns with their medications: No  Patient reports access/transportation concerns to their pharmacy: No  Patient reports adherence concerns with their medications:  No     Hyperlipidemia/ASCVD Risk Reduction with primarily Hypertriglyceridemia   Current lipid lowering medications: gemfibrozil 600 mg twice daily; patient self-started over the counter omega-3-fatty acids 1300 mg two capsules twice daily  Denies stomach upset, muscle aches or pains  Medications tried in the past:   Current dietary changes:  - Breakfast: cut back on breakfast lately - Lunch: french fries, and piece of fried chicken;  - Supper: continues to focus on grilled chicken, vegetables;   Has cut back on beers - 1-2 per week  Current physical activity: walks a lot at work;  Tobacco Abuse:   Tobacco Use History: Age when started using tobacco on a daily basis: 13 Number of cigarettes per day: 1 pack lasts 4 days now instead of 2; reports his biggest times of cravings are after meals    Objective:  No results found for: "HGBA1C"  Lab Results  Component Value Date   CREATININE 0.83 05/06/2022   BUN 13  05/06/2022   NA 137 05/06/2022   K 3.8 05/06/2022   CL 100 05/06/2022   CO2 18 (L) 05/06/2022    Lab Results  Component Value Date   CHOL 356 (H) 05/06/2022   HDL 22 (L) 05/06/2022   LDLCALC Comment (A) 05/06/2022   TRIG 1,857 ([place]) 05/06/2022   CHOLHDL 16.2 (H) 05/06/2022    Medications Reviewed Today     Reviewed by Osker Mason, RPH-CPP (Pharmacist) on 06/24/22 at 36  Med List Status: <None>   Medication Order Taking? Sig Documenting Provider Last Dose Status Informant  citalopram (CELEXA) 40 MG tablet [id number] Yes Take 1 tablet (40 mg total) by mouth daily. Fenton Foy, NP Taking Active   gemfibrozil (LOPID) 600 MG tablet [id number] Yes Take 1 tablet (600 mg total) by mouth 2 (two) times daily before a meal. Fenton Foy, NP Taking Active   OMEGA-3 FATTY ACIDS PO [id number] Yes Take 2 capsules by mouth 2 (two) times daily. [provider] Taking Active               Assessment/Plan:   Hyperlipidemia/ASCVD Risk Reduction: - Currently uncontrolled.  - Reviewed long term complications of uncontrolled cholesterol - Reviewed dietary recommendations including: praised for cutting back on alcohol, carbs;  - Reviewed lifestyle recommendations including: encourage focus to incorporate 150 minutes of moderate intensity exercise weekly - Recommend to recheck fasting lipid panel in ~ 4 weeks. Scheduled lab appointment, will discuss ordering panel with PCP  Tobacco Abuse - Currently improving - Provided motivational interviewing to assess tobacco use and strategies for reduction -  Encouraged to set a goal of 1 pack lasting a week. Consider strategies to help mitigate cravings.   Follow Up Plan: labs in 4 weeks  Catie TJodi Mourning, PharmD, [place], [facility] [phone]

## 2022-06-24 NOTE — Patient Instructions (Signed)
[place],   [place] has recently decreased the income cut offs for Medicaid. Check here and see if you and your family qualify and how to apply:   [email or web address]   Take care,   Catie Jodi Mourning, PharmD

## 2022-07-09 ENCOUNTER — Other Ambulatory Visit: Payer: Self-pay

## 2022-07-09 DIAGNOSIS — E781 Pure hyperglyceridemia: Secondary | ICD-10-CM

## 2022-07-09 NOTE — Progress Notes (Signed)
Error

## 2022-07-10 ENCOUNTER — Other Ambulatory Visit: Payer: Self-pay | Admitting: Nurse Practitioner

## 2022-07-10 DIAGNOSIS — E781 Pure hyperglyceridemia: Secondary | ICD-10-CM

## 2022-07-10 LAB — LIPID PANEL
Chol/HDL Ratio: 6.4 ratio — ABNORMAL HIGH (ref 0.0–5.0)
Cholesterol, Total: 178 mg/dL (ref 100–199)
HDL: 28 mg/dL — ABNORMAL LOW (ref >39)
LDL Chol Calc (NIH): 111 mg/dL — ABNORMAL HIGH (ref 0–99)
Triglycerides: 224 mg/dL — ABNORMAL HIGH (ref 0–149)
VLDL Cholesterol Cal: 39 mg/dL (ref 5–40)

## 2022-07-10 LAB — LDL CHOLESTEROL, DIRECT: LDL Direct: 108 mg/dL — ABNORMAL HIGH (ref 0–99)

## 2022-07-21 ENCOUNTER — Encounter: Payer: Self-pay | Admitting: Nurse Practitioner

## 2022-08-05 ENCOUNTER — Other Ambulatory Visit: Payer: Self-pay | Admitting: Nurse Practitioner

## 2022-08-05 DIAGNOSIS — E781 Pure hyperglyceridemia: Secondary | ICD-10-CM

## 2022-08-22 ENCOUNTER — Ambulatory Visit: Payer: Self-pay | Attending: Internal Medicine | Admitting: Internal Medicine

## 2022-09-12 ENCOUNTER — Other Ambulatory Visit: Payer: Self-pay | Admitting: Nurse Practitioner

## 2022-09-12 DIAGNOSIS — F419 Anxiety disorder, unspecified: Secondary | ICD-10-CM

## 2022-09-12 NOTE — Telephone Encounter (Signed)
Please advise KH 

## 2022-09-30 ENCOUNTER — Telehealth: Payer: Self-pay | Admitting: Nurse Practitioner

## 2022-09-30 NOTE — Telephone Encounter (Signed)
Pt called stating that his prescription for citalopram keeps being sent in for only 1 month with no refills. He is requesting if you are able to send it in for 6 months because he sees you every 6 months.   He recently picked up a new prescription, so he does not need a refill at this time, but was wanting to get it straightened out before his next refill.

## 2022-10-01 ENCOUNTER — Other Ambulatory Visit: Payer: Self-pay | Admitting: Nurse Practitioner

## 2022-10-01 DIAGNOSIS — F419 Anxiety disorder, unspecified: Secondary | ICD-10-CM

## 2022-10-01 MED ORDER — CITALOPRAM HYDROBROMIDE 40 MG PO TABS: 40.0000 mg | ORAL_TABLET | Freq: Every day | ORAL | 2 refills | Status: DC

## 2022-10-24 ENCOUNTER — Ambulatory Visit: Payer: Self-pay | Admitting: Nurse Practitioner

## 2022-10-27 ENCOUNTER — Ambulatory Visit: Payer: Self-pay | Admitting: Nurse Practitioner

## 2022-11-14 ENCOUNTER — Ambulatory Visit: Payer: Self-pay | Admitting: Nurse Practitioner

## 2022-12-10 ENCOUNTER — Ambulatory Visit: Payer: Self-pay | Admitting: Nurse Practitioner

## 2023-03-03 ENCOUNTER — Other Ambulatory Visit: Payer: Self-pay | Admitting: Nurse Practitioner

## 2023-03-03 DIAGNOSIS — E781 Pure hyperglyceridemia: Secondary | ICD-10-CM

## 2023-05-15 ENCOUNTER — Other Ambulatory Visit: Payer: Self-pay

## 2023-05-15 DIAGNOSIS — E781 Pure hyperglyceridemia: Secondary | ICD-10-CM

## 2023-05-15 MED ORDER — GEMFIBROZIL 600 MG PO TABS: 600.0000 mg | ORAL_TABLET | Freq: Two times a day (BID) | ORAL | 0 refills | Status: DC

## 2023-05-15 NOTE — Telephone Encounter (Signed)
 Copied from CRM [id number]. Topic: Clinical - Prescription Issue >> May 15, 2023  9:54 AM Powell HERO wrote: Reason for CRM: Patient called in on 05/07/23 to get emfibrozil (LOPID ) 600 MG tablet refilled. Nothing has been sent over to the pharmacy and I don't see any indication that it was completed.  Pt was scheduled for a virtual . Please advise if you would fill until then. (06/08/23) [facility]

## 2023-06-08 ENCOUNTER — Telehealth (INDEPENDENT_AMBULATORY_CARE_PROVIDER_SITE_OTHER): Payer: Self-pay | Admitting: Nurse Practitioner

## 2023-06-08 DIAGNOSIS — F419 Anxiety disorder, unspecified: Secondary | ICD-10-CM

## 2023-06-08 DIAGNOSIS — F32A Depression, unspecified: Secondary | ICD-10-CM

## 2023-06-08 DIAGNOSIS — E781 Pure hyperglyceridemia: Secondary | ICD-10-CM

## 2023-06-08 MED ORDER — GEMFIBROZIL 600 MG PO TABS: 600.0000 mg | ORAL_TABLET | Freq: Two times a day (BID) | ORAL | 0 refills | Status: AC

## 2023-06-08 MED ORDER — CITALOPRAM HYDROBROMIDE 40 MG PO TABS: 40.0000 mg | ORAL_TABLET | Freq: Every day | ORAL | 2 refills | Status: AC

## 2023-06-08 NOTE — Progress Notes (Unsigned)
Virtual Visit via Video Note  I connected with Donald Nunez on 06/08/23 at  2:40 PM EST by a video enabled telemedicine application and verified that I am speaking with the correct person using two identifiers.  Location: Patient: home Provider: office   I discussed the limitations of evaluation and management by telemedicine and the availability of in person appointments. The patient expressed understanding and agreed to proceed.  History of Present Illness:    Observations/Objective:     04/25/2022    8:45 AM 10/24/2021    8:31 AM 09/18/2021    2:21 PM  Vitals with BMI  Height 6\' 0"  6\' 0"  6\' 0"   Weight 223 lbs 223 lbs 8 oz 218 lbs 4 oz  BMI 30.24 30.31 29.59  Systolic  125 149  Diastolic  67 91  Pulse  62 75      Assessment and Plan:  1. High triglycerides  - gemfibrozil (LOPID) 600 MG tablet; Take 1 tablet (600 mg total) by mouth 2 (two) times daily before a meal.  Dispense: 60 tablet; Refill: 0 - Lipid Panel; Future - CBC; Future - Comprehensive metabolic panel; Future  2. Anxiety and depression  - citalopram (CELEXA) 40 MG tablet; Take 1 tablet (40 mg total) by mouth daily.  Dispense: 90 tablet; Refill: 2  Follow up:  Follow up in 3 months     I discussed the assessment and treatment plan with the patient. The patient was provided an opportunity to ask questions and all were answered. The patient agreed with the plan and demonstrated an understanding of the instructions.   The patient was advised to call back or seek an in-person evaluation if the symptoms worsen or if the condition fails to improve as anticipated.  I provided 23 minutes of non-face-to-face time during this encounter.   Ivonne Andrew, NP

## 2023-06-15 ENCOUNTER — Encounter: Payer: Self-pay | Admitting: Nurse Practitioner
